# Patient Record
Sex: Female | Born: 1980 | Race: White | Hispanic: No | Marital: Married | Smoking: Never smoker
Health system: Southern US, Community
[De-identification: ages and names within clinical notes are randomized; demographics above are authoritative.]

---

## 2020-01-24 ENCOUNTER — Encounter (HOSPITAL_COMMUNITY): Payer: Self-pay | Admitting: *Deleted

## 2020-01-24 ENCOUNTER — Emergency Department (HOSPITAL_COMMUNITY): Payer: BLUE CROSS/BLUE SHIELD

## 2020-01-24 ENCOUNTER — Other Ambulatory Visit: Payer: Self-pay

## 2020-01-24 ENCOUNTER — Emergency Department (HOSPITAL_COMMUNITY)
Admission: EM | Admit: 2020-01-24 | Discharge: 2020-01-24 | Disposition: A | Discharge status: Home / Self Care | Payer: BLUE CROSS/BLUE SHIELD | Attending: Emergency Medicine | Admitting: Emergency Medicine

## 2020-01-24 DIAGNOSIS — Y939 Activity, unspecified: Secondary | ICD-10-CM | POA: Diagnosis not present

## 2020-01-24 DIAGNOSIS — Y999 Unspecified external cause status: Secondary | ICD-10-CM | POA: Diagnosis not present

## 2020-01-24 DIAGNOSIS — S2242XA Multiple fractures of ribs, left side, initial encounter for closed fracture: Secondary | ICD-10-CM | POA: Diagnosis not present

## 2020-01-24 DIAGNOSIS — Z23 Encounter for immunization: Secondary | ICD-10-CM | POA: Insufficient documentation

## 2020-01-24 DIAGNOSIS — Y92410 Unspecified street and highway as the place of occurrence of the external cause: Secondary | ICD-10-CM | POA: Insufficient documentation

## 2020-01-24 DIAGNOSIS — S299XXA Unspecified injury of thorax, initial encounter: Secondary | ICD-10-CM | POA: Diagnosis present

## 2020-01-24 LAB — COMPREHENSIVE METABOLIC PANEL
ALT: 15 U/L (ref 0–44)
AST: 18 U/L (ref 15–41)
Albumin: 3.8 g/dL (ref 3.5–5.0)
Alkaline Phosphatase: 53 U/L (ref 38–126)
Anion gap: 8 (ref 5–15)
BUN: 8 mg/dL (ref 6–20)
CO2: 25 mmol/L (ref 22–32)
Calcium: 8.9 mg/dL (ref 8.9–10.3)
Chloride: 108 mmol/L (ref 98–111)
Creatinine, Ser: 0.79 mg/dL (ref 0.44–1.00)
GFR calc Af Amer: >60 mL/min (ref >60)
GFR calc non Af Amer: >60 mL/min (ref >60)
Glucose, Bld: 128 mg/dL — ABNORMAL HIGH (ref 70–99)
Potassium: 4 mmol/L (ref 3.5–5.1)
Sodium: 141 mmol/L (ref 135–145)
Total Bilirubin: 0.6 mg/dL (ref 0.3–1.2)
Total Protein: 6.6 g/dL (ref 6.5–8.1)

## 2020-01-24 LAB — CBC WITH DIFFERENTIAL/PLATELET
Abs Immature Granulocytes: 0.1 10*3/uL — ABNORMAL HIGH (ref 0.00–0.07)
Basophils Absolute: 0 10*3/uL (ref 0.0–0.1)
Basophils Relative: 0 %
Eosinophils Absolute: 0 10*3/uL (ref 0.0–0.5)
Eosinophils Relative: 0 %
HCT: 42 % (ref 36.0–46.0)
Hemoglobin: 13.3 g/dL (ref 12.0–15.0)
Immature Granulocytes: 1 %
Lymphocytes Relative: 5 %
Lymphs Abs: 1 10*3/uL (ref 0.7–4.0)
MCH: 27.8 pg (ref 26.0–34.0)
MCHC: 31.7 g/dL (ref 30.0–36.0)
MCV: 87.9 fL (ref 80.0–100.0)
Monocytes Absolute: 1.1 10*3/uL — ABNORMAL HIGH (ref 0.1–1.0)
Monocytes Relative: 6 %
Neutro Abs: 16.9 10*3/uL — ABNORMAL HIGH (ref 1.7–7.7)
Neutrophils Relative %: 88 %
Platelets: 243 10*3/uL (ref 150–400)
RBC: 4.78 MIL/uL (ref 3.87–5.11)
RDW: 14.2 % (ref 11.5–15.5)
WBC: 19.1 10*3/uL — ABNORMAL HIGH (ref 4.0–10.5)
nRBC: 0 % (ref 0.0–0.2)

## 2020-01-24 LAB — LIPASE, BLOOD: Lipase: 28 U/L (ref 11–51)

## 2020-01-24 MED ORDER — DIAZEPAM 2 MG PO TABS
2.0000 mg | ORAL_TABLET | Freq: Once | ORAL | Status: AC
  Administered 2020-01-24: 2 mg via ORAL
  Filled 2020-01-24: qty 1

## 2020-01-24 MED ORDER — ACETAMINOPHEN 500 MG PO TABS
1000.0000 mg | ORAL_TABLET | Freq: Once | ORAL | Status: AC
  Administered 2020-01-24: 1000 mg via ORAL
  Filled 2020-01-24: qty 2

## 2020-01-24 MED ORDER — IOHEXOL 300 MG/ML  SOLN
100.0000 mL | Freq: Once | INTRAMUSCULAR | Status: AC | PRN
  Administered 2020-01-24: 100 mL via INTRAVENOUS

## 2020-01-24 MED ORDER — TETANUS-DIPHTH-ACELL PERTUSSIS 5-2.5-18.5 LF-MCG/0.5 IM SUSP
0.5000 mL | Freq: Once | INTRAMUSCULAR | Status: AC
  Administered 2020-01-24: 0.5 mL via INTRAMUSCULAR
  Filled 2020-01-24: qty 0.5

## 2020-01-24 MED ORDER — MORPHINE SULFATE (PF) 4 MG/ML IV SOLN
6.0000 mg | Freq: Once | INTRAVENOUS | Status: AC
  Administered 2020-01-24: 6 mg via INTRAVENOUS
  Filled 2020-01-24: qty 2

## 2020-01-24 MED ORDER — ACETAMINOPHEN ER 650 MG PO TBCR: 650.0000 mg | EXTENDED_RELEASE_TABLET | Freq: Three times a day (TID) | ORAL | 0 refills | Status: DC

## 2020-01-24 MED ORDER — METHOCARBAMOL 500 MG PO TABS: 500.0000 mg | ORAL_TABLET | Freq: Two times a day (BID) | ORAL | 0 refills | Status: DC

## 2020-01-24 MED ORDER — ONDANSETRON HCL 4 MG/2ML IJ SOLN
4.0000 mg | Freq: Once | INTRAMUSCULAR | Status: AC
  Administered 2020-01-24: 4 mg via INTRAVENOUS
  Filled 2020-01-24: qty 2

## 2020-01-24 MED ORDER — OXYCODONE HCL 5 MG PO TABS
5.0000 mg | ORAL_TABLET | Freq: Once | ORAL | Status: AC
  Administered 2020-01-24: 5 mg via ORAL
  Filled 2020-01-24: qty 1

## 2020-01-24 MED ORDER — OXYCODONE-ACETAMINOPHEN 5-325 MG PO TABS: 1.0000 | ORAL_TABLET | ORAL | 0 refills | Status: DC | PRN
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
## 2020-01-24 NOTE — ED Provider Notes (Signed)
  Physical Exam  BP 116/67   Pulse 75   Temp 97.9 F (36.6 C) (Oral)   Resp 16   Ht 5\' 4"  (1.626 m)   Wt 68 kg   LMP  (LMP Unknown) Comment: on birth control  SpO2 97%   BMI 25.75 kg/m   Physical Exam  ED Course/Procedures     .Critical Care Performed by: , MD Authorized by: Derwood Kaplan, MD   Critical care provider statement:    Critical care time (minutes):  15   Critical care was necessary to treat or prevent imminent or life-threatening deterioration of the following conditions:  Trauma   Critical care was time spent personally by me on the following activities:  Discussions with consultants, evaluation of patient's response to treatment, examination of patient, ordering and performing treatments and interventions, ordering and review of laboratory studies, ordering and review of radiographic studies, pulse oximetry, re-evaluation of patient's condition, obtaining history from patient or surrogate and review of old charts    MDM   Patient had come in with chief complaint of trauma.   CT scan of the abdomen, pelvis and chest reviewed.  Patient has multiple rib fractures without any intrathoracic or intra-abdominal complications.  Pain was relatively well controlled with the medications provided in the ER.  Incentive spirometer given.  Patient is comfortable going home with pain control.  She has been advised to follow-up with her PCP in 1 week.  Strict ER return precautions have been discussed, and patient is agreeing with the plan and is comfortable with the workup done and the recommendations from the ER.     Derwood Kaplan, MD 01/24/20 1850
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
## 2020-01-24 NOTE — ED Notes (Signed)
Patient verbalizes understanding of discharge instructions. Opportunity for questioning and answers were provided. Pt discharged from ED. 

## 2020-01-24 NOTE — ED Notes (Signed)
Got patient undressed into a gown patient is resting with call bell in reach  
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
## 2023-04-04 ENCOUNTER — Ambulatory Visit: Payer: Medicaid Other | Admitting: Student

## 2023-04-04 ENCOUNTER — Encounter: Payer: Self-pay | Admitting: Student

## 2023-04-04 VITALS — BP 114/76 | HR 74 | Temp 98.3°F | Resp 16 | Ht 64.0 in | Wt 186.2 lb

## 2023-04-04 DIAGNOSIS — Z6831 Body mass index (BMI) 31.0-31.9, adult: Secondary | ICD-10-CM

## 2023-04-04 DIAGNOSIS — Z23 Encounter for immunization: Secondary | ICD-10-CM

## 2023-04-04 DIAGNOSIS — M545 Low back pain, unspecified: Secondary | ICD-10-CM | POA: Diagnosis not present

## 2023-04-04 DIAGNOSIS — Z0000 Encounter for general adult medical examination without abnormal findings: Secondary | ICD-10-CM | POA: Insufficient documentation

## 2023-04-04 MED ORDER — NAPROXEN 500 MG PO TABS: 500.0000 mg | ORAL_TABLET | Freq: Two times a day (BID) | ORAL | 1 refills | Status: AC

## 2023-04-04 MED ORDER — METHOCARBAMOL 750 MG PO TABS: 750.0000 mg | ORAL_TABLET | Freq: Two times a day (BID) | ORAL | 0 refills | Status: AC | PRN

## 2023-04-04 NOTE — Assessment & Plan Note (Signed)
Flu shot given today

## 2023-04-04 NOTE — Assessment & Plan Note (Signed)
I believe this is muscle strain versus sciatica. She was provided with stretching instructions. She will [place] robaxin and naproxen as needed.

## 2023-04-04 NOTE — Patient Instructions (Signed)
Naproxen- do not use any other NSAIDS while taking this.   Robaxin- is a muscle relaxer, use for muscle spasms and while at home.

## 2023-04-04 NOTE — Progress Notes (Signed)
Established Patient Office Visit  Subjective   Patient ID: RAIZEL WESOLOWSKI, female    DOB: 03/27/1981  Age: 42 y.o. MRN: [id number]  Chief Complaint  Patient presents with   Establish Care    Changed Drs. Has a pap every March at OB/GYN. Dr Enrique Sack. Had a Mammogram April of 2024.     HPI  Ozella Comins Daigneault 42 year old Caucasian female presents to establish care. She is married with 1 child and works in [employer]. Her medical history includes seasonal allergies using zyrtec daily for years.  Today she tells me of lumbar pain on the right side that has been present for a few months. She isn't able to recall a specific injury to the site. She denies swelling or redness on her back, no radiation down the right leg, no numbness or weakness of the extremity, using ice/heat and OTC medications for relief. She says the pain presents after lifting or bending (she does this for work) and interrupts her sleep at times. Pain is 1-2/10.     Review of Systems  Constitutional: Negative.   HENT: Negative.    Respiratory: Negative.    Cardiovascular: Negative.   Gastrointestinal: Negative.   Genitourinary: Negative.   Musculoskeletal:  Positive for back pain.  Skin: Negative.   Neurological: Negative.   Endo/Heme/Allergies: Negative.   Psychiatric/Behavioral: Negative.        Objective:     BP 114/76 (BP Location: Left Arm, Patient Position: Sitting)   Pulse 74   Temp 98.3 F (36.8 C)   Resp 16   Ht 5\' 4"  (1.626 m)   Wt 186 lb 4 oz (84.5 kg)   LMP 03/29/2023 (Approximate)   SpO2 99%   BMI 31.97 kg/m    Physical Exam Vitals reviewed.  Constitutional:      Appearance: Normal appearance.  Cardiovascular:     Rate and Rhythm: Normal rate and regular rhythm.     Pulses: Normal pulses.     Heart sounds: Normal heart sounds.  Pulmonary:     Effort: Pulmonary effort is normal.     Breath sounds: Normal breath sounds.  Abdominal:     General: Bowel sounds are  normal. There is no distension.     Palpations: Abdomen is soft.  Musculoskeletal:        General: No swelling or tenderness. Normal range of motion.     Right lower leg: No edema.     Left lower leg: No edema.  Skin:    General: Skin is warm and dry.     Capillary Refill: Capillary refill takes less than 2 seconds.  Neurological:     General: No focal deficit present.     Mental Status: She is alert and oriented to person, place, and time.  Psychiatric:        Mood and Affect: Mood normal.        Behavior: Behavior normal.         Assessment & Plan:   Problem List Items Addressed This Visit     Right lumbar pain - Primary    I believe this is muscle strain versus sciatica. She was provided with stretching instructions. She will [place] robaxin and naproxen as needed.       Preventative health care    Flu shot given today.      Relevant Orders   Influenza, MDCK, trivalent, PF(Flucelvax egg-free) (Completed)    Return in about 2 months (around 06/04/2023) for annual physical .  Edwena Blow, NP

## 2023-05-11 ENCOUNTER — Ambulatory Visit: Payer: Medicaid Other | Admitting: Student

## 2023-05-11 ENCOUNTER — Encounter: Payer: Self-pay | Admitting: Student

## 2023-05-11 VITALS — BP 126/80 | HR 81 | Temp 97.8°F | Resp 18 | Ht 64.0 in | Wt 186.2 lb

## 2023-05-11 DIAGNOSIS — J0110 Acute frontal sinusitis, unspecified: Secondary | ICD-10-CM | POA: Diagnosis not present

## 2023-05-11 DIAGNOSIS — R059 Cough, unspecified: Secondary | ICD-10-CM | POA: Diagnosis not present

## 2023-05-11 LAB — POC COVID19 BINAXNOW: SARS Coronavirus 2 Ag: NEGATIVE

## 2023-05-11 MED ORDER — BENZONATATE 100 MG PO CAPS: 100.0000 mg | ORAL_CAPSULE | Freq: Three times a day (TID) | ORAL | 0 refills | Status: DC | PRN

## 2023-05-11 MED ORDER — IPRATROPIUM BROMIDE 0.03 % NA SOLN: 2.0000 | Freq: Two times a day (BID) | NASAL | 0 refills | Status: DC

## 2023-05-11 NOTE — Assessment & Plan Note (Signed)
She will use ipratropium nasal spray twice a day, benzonatate cough suppressant pearls along with other supportive care measures. Instructions provided.

## 2023-05-11 NOTE — Progress Notes (Signed)
Acute Office Visit  Subjective:     Patient ID: Helen Cantu, female    DOB: [birth year], 42 y.o.   MRN: [id number]  Chief Complaint  Patient presents with   Sinus Problem    X 4 days, sinus pressure, clear discharge, sneezing, no sore throat, no ear issues, no fever, dry cough. She has been using OTC: Equate Cold & Flu.    HPI  Subjective:     Helen Cantu is a 42 y.o. female who presents for evaluation of sinus pain. Symptoms include: congestion, cough, foul rhinorrhea, nasal congestion, sinus pressure, and sneezing. Onset of symptoms was 4 days ago. Symptoms have been gradually worsening since that time. Past history is significant for no history of pneumonia or bronchitis. Patient is a non-smoker. Using cold and flu symptom OTC relief and nasal spray.   Review of Systems  Constitutional: Negative.   HENT:  Positive for congestion.   Eyes: Negative.   Respiratory:  Positive for cough. Negative for shortness of breath.   Cardiovascular: Negative.   Gastrointestinal: Negative.   Genitourinary: Negative.   Musculoskeletal:  Negative for myalgias.  Skin: Negative.   Neurological: Negative.   Endo/Heme/Allergies:  Positive for environmental allergies.  Psychiatric/Behavioral: Negative.          Objective:    BP 126/80 (BP Location: Left Arm, Patient Position: Sitting)   Pulse 81   Temp 97.8 F (36.6 C) (Temporal)   Resp 18   Ht 5\' 4"  (1.626 m)   Wt 186 lb 4 oz (84.5 kg)   SpO2 98%   BMI 31.97 kg/m    Physical Exam Vitals reviewed.  Constitutional:      Appearance: Normal appearance.  HENT:     Head: Normocephalic and atraumatic.     Right Ear: Tympanic membrane, ear canal and external ear normal.     Left Ear: Tympanic membrane and ear canal normal.     Nose: Congestion present. No rhinorrhea.     Mouth/Throat:     Mouth: Mucous membranes are moist.     Pharynx: Oropharynx is clear. Posterior oropharyngeal erythema present.  Eyes:      Conjunctiva/sclera: Conjunctivae normal.     Pupils: Pupils are equal, round, and reactive to light.  Cardiovascular:     Rate and Rhythm: Normal rate and regular rhythm.     Pulses: Normal pulses.     Heart sounds: Normal heart sounds.  Pulmonary:     Effort: Pulmonary effort is normal.     Breath sounds: Normal breath sounds. No stridor. No wheezing, rhonchi or rales.  Abdominal:     General: Abdomen is flat.     Palpations: Abdomen is soft.  Musculoskeletal:     Cervical back: Normal range of motion and neck supple. No tenderness.  Lymphadenopathy:     Cervical: No cervical adenopathy.  Skin:    General: Skin is warm and dry.     Capillary Refill: Capillary refill takes less than 2 seconds.  Neurological:     General: No focal deficit present.     Mental Status: She is alert and oriented to person, place, and time.  Psychiatric:        Mood and Affect: Mood normal.        Behavior: Behavior normal.     Results for orders placed or performed in visit on 05/11/23  POC COVID-19 BinaxNow  Result Value Ref Range   SARS Coronavirus 2 Ag Negative Negative  Assessment & Plan:   Problem List Items Addressed This Visit     Acute non-recurrent frontal sinusitis    She will use ipratropium nasal spray twice a day, benzonatate cough suppressant pearls along with other supportive care measures. Instructions provided.        Relevant Medications   ipratropium (ATROVENT) 0.03 % nasal spray   benzonatate (TESSALON PERLES) 100 MG capsule   Other Visit Diagnoses     Cough in adult    -  Primary   Relevant Orders   POC COVID-19 BinaxNow (Completed)       Meds ordered this encounter  Medications   ipratropium (ATROVENT) 0.03 % nasal spray    Sig: Place 2 sprays into both nostrils every 12 (twelve) hours.    Dispense:  30 mL    Refill:  0   benzonatate (TESSALON PERLES) 100 MG capsule    Sig: Take 1 capsule (100 mg total) by mouth 3 (three) times daily as needed for  cough.    Dispense:  30 capsule    Refill:  0    Return if symptoms worsen or fail to improve.  Edwena Blow, NP

## 2023-05-11 NOTE — Patient Instructions (Addendum)
Please add Zytrec  daily and Mucinex use as directed. Also increase your oral hydration.   What to use for symptom management: pseudoephedrine (for decongestant) antihistamine (like Zyrtec, Claritin, or Allegra) (to dry up) Guaifenesin (to thin out and break up mucus) Dextromethorphan (if coughing) acetaminophen (for aches, pain, or fever) Coricidin HBP (use for nasal congestion if you have high blood pressure)

## 2023-05-25 ENCOUNTER — Other Ambulatory Visit: Payer: Self-pay | Admitting: Student

## 2023-05-31 ENCOUNTER — Encounter: Payer: Self-pay | Admitting: Student

## 2023-05-31 ENCOUNTER — Ambulatory Visit: Payer: Medicaid Other | Admitting: Student

## 2023-05-31 VITALS — BP 116/82 | HR 74 | Temp 97.7°F | Resp 18 | Ht 64.0 in | Wt 180.2 lb

## 2023-05-31 DIAGNOSIS — E669 Obesity, unspecified: Secondary | ICD-10-CM

## 2023-05-31 DIAGNOSIS — Z6830 Body mass index (BMI) 30.0-30.9, adult: Secondary | ICD-10-CM

## 2023-05-31 DIAGNOSIS — Z1321 Encounter for screening for nutritional disorder: Secondary | ICD-10-CM

## 2023-05-31 DIAGNOSIS — Z0000 Encounter for general adult medical examination without abnormal findings: Secondary | ICD-10-CM

## 2023-05-31 DIAGNOSIS — Z1329 Encounter for screening for other suspected endocrine disorder: Secondary | ICD-10-CM | POA: Diagnosis not present

## 2023-05-31 NOTE — Progress Notes (Signed)
 Patient: Helen Cantu, Female    DOB: Jul 24, 1980, 42 y.o.   MRN: [id number] Nasario Badder, NP Visit Date: 05/31/2023   Chief Complaint  Patient presents with   Annual Exam    Annual exam, she is fasting this morning and she stated she goes to Dr Valeri Gate in [place] for OB/GYN exams. Last Mammogram was in October 2024, follow up Mammogram in April of 2025. They are monitoring a spot on her right breast.   Subjective:   Annual physical exam:  Helen Cantu is a 42 y.o. female who presents today for annual health maintenance exam: The patient may be due for the following health maintenance studies: mammogram, colonoscopy, bone density screening, cervical pap/breast exam, and screening labs.    For exercise and activity the patient participates in frequent walking.  Diet: low calorie intake and monitoring portion  Smoke: does not smoke Alcohol screening: does not drink alcohol Eye Exam: she has cataracts 04/2023 last visit. Dental Exam: needs updating   Past medical, surgical, social and family histories reviewed and updated.    Separate from wellness visit today:  **The patient was informed that charges may be incurred for discussing concerns outside of annual exam.   SDOH Screenings   Depression (PHQ2-9): Low Risk  (04/04/2023)  Tobacco Use: Low Risk  (05/31/2023)  Recent Concern: Tobacco Use - Medium Risk (04/04/2023)     USPSTF grade A and B recommendations - reviewed and addressed today  Immunizations and Health Maintenance: Health Maintenance  Topic Date Due   HIV Screening  Never done   Hepatitis C Screening  Never done   Cervical Cancer Screening (HPV/Pap Cotest)  Never done   COVID-19 Vaccine (1 - 2024-25 season) Never done   DTaP/Tdap/Td (2 - Td or Tdap) 01/23/2030   INFLUENZA VACCINE  Completed   HPV VACCINES  Aged Out      Immunizations: Immunization History  Administered Date(s) Administered   Influenza, Mdck, Trivalent,PF 6+  MOS(egg free) 04/04/2023   Tdap 01/24/2020     STD testing and prevention (HIV/chl/gon/syphilis):  see above, no additional testing desired by pt today  Intimate partner violence: Denies concerns  Sexual History/Pain during Intercourse: Married  Menstrual History/LMP/Abnormal Bleeding:  Patient's last menstrual period was 05/17/2023 (approximate).  Incontinence Symptoms: denies   Breast cancer:  Last Mammogram: Last mammogram 03/2023 following a spot seen on imaging, next exam due in 08/2023 BRCA gene screening: n/a  Cervical cancer screening: not due until 2027- 07/2020 with negative HPV Pt denies family hx of cancers - breast, ovarian, uterine, colon:     Osteoporosis:   Discussion on osteoporosis per age, including high calcium and vitamin D  supplementation, weight bearing exercises Pt is does not supplementing with daily calcium/Vit D. Roughly experienced menopause at age n/a  High risk: Get a DEXA scan every two years Moderate risk: Get a DEXA scan every 3 to 5 years Low risk: Get a DEXA scan every 10 to 15 years   Skin cancer:  Hx of skin CA -none, family history.  Discussed atypical lesions.   Colorectal cancer:   Colonoscopy is n/a   Discussed concerning signs and sx of CRC, pt denies.   Lung cancer:   Low Dose CT Chest recommended if Age 47-80 years, 20 pack-year currently smoking OR have quit w/in 15years. Patient does not qualify.    Social History   Tobacco Use   Smoking status: Never    Passive exposure: Never   Smokeless tobacco:  Never  Substance Use Topics   Drug use: Never       No family history on file.   Blood pressure/Hypertension: BP Readings from Last 3 Encounters:  05/31/23 116/82  05/11/23 126/80  04/04/23 114/76     Social History       Social History   Socioeconomic History   Marital status: Married    Spouse name: Not on file   Number of children: Not on file   Years of education: Not on file   Highest education level:  Not on file  Occupational History   Not on file  Tobacco Use   Smoking status: Never    Passive exposure: Never   Smokeless tobacco: Never  Substance and Sexual Activity   Alcohol use: Not on file    Comment: at age 59   Drug use: Never   Sexual activity: Not on file    Comment: Uses birth control pill  Other Topics Concern   Not on file  Social History Narrative   Not on file   Social Drivers of Health   Financial Resource Strain: Not on file  Food Insecurity: Not on file  Transportation Needs: Not on file  Physical Activity: Not on file  Stress: Not on file  Social Connections: Not on file    Family History       No family history on file.  Patient Active Problem List   Diagnosis Date Noted   Acute non-recurrent frontal sinusitis 05/11/2023   Right lumbar pain 04/04/2023   Preventative health care 04/04/2023    No past surgical history on file.   Current Outpatient Medications:    cetirizine (ZYRTEC) 10 MG tablet, Take 10 mg by mouth daily. Takes all year round for allergies, Disp: , Rfl:    methocarbamol  (ROBAXIN -750) 750 MG tablet, Take 1 tablet (750 mg total) by mouth 2 (two) times daily as needed for muscle spasms., Disp: 30 tablet, Rfl: 0   Multiple Vitamin (MULTIVITAMIN ADULT PO), Take 1 tablet by mouth daily., Disp: , Rfl:    naproxen  (NAPROSYN ) 500 MG tablet, Take 1 tablet (500 mg total) by mouth 2 (two) times daily with a meal., Disp: 30 tablet, Rfl: 1   norethindrone (MICRONOR) 0.35 MG tablet, Take 1 tablet by mouth daily., Disp: , Rfl:   No Active Allergies  Patient Care Team: Elonda Giuliano, Trina Fujita, NP as PCP - General (Internal Medicine)  Advanced Care Planning:  A voluntary discussion about advance care planning including the explanation and discussion of advance directives.   Discussed health care proxy and Living will, and the patient was able to identify a health care proxy as her husband.   Patient doe not have a living will at present time.     Review of Systems Review of Systems  Constitutional: Negative.   HENT: Negative.    Eyes: Negative.   Respiratory: Negative.    Cardiovascular: Negative.   Gastrointestinal: Negative.   Genitourinary: Negative.   Musculoskeletal: Negative.   Skin: Negative.   Neurological: Negative.   Endo/Heme/Allergies: Negative.   Psychiatric/Behavioral: Negative.    All other systems reviewed and are negative.         Objective:   Vitals:   BP 116/82 (BP Location: Right Arm, Patient Position: Sitting)   Pulse 74   Temp 97.7 F (36.5 C) (Temporal)   Resp 18   Ht 5\' 4"  (1.626 m)   Wt 180 lb 4 oz (81.8 kg)   LMP 05/17/2023 (Approximate)  SpO2 96%   BMI 30.94 kg/m    Physical Examination Physical Exam Vitals reviewed.  Constitutional:      Appearance: Normal appearance. She is obese.  HENT:     Head: Normocephalic and atraumatic.     Right Ear: Tympanic membrane, ear canal and external ear normal.     Left Ear: Tympanic membrane, ear canal and external ear normal.     Nose: Nose normal.     Mouth/Throat:     Mouth: Mucous membranes are dry.     Pharynx: Oropharynx is clear.  Eyes:     Conjunctiva/sclera: Conjunctivae normal.     Pupils: Pupils are equal, round, and reactive to light.  Cardiovascular:     Rate and Rhythm: Normal rate and regular rhythm.     Pulses: Normal pulses.     Heart sounds: Normal heart sounds.  Pulmonary:     Effort: Pulmonary effort is normal.  Abdominal:     General: Bowel sounds are normal.     Palpations: Abdomen is soft.     Tenderness: There is no abdominal tenderness.  Musculoskeletal:        General: Normal range of motion.     Cervical back: Normal range of motion and neck supple.     Right lower leg: No edema.     Left lower leg: No edema.  Lymphadenopathy:     Cervical: No cervical adenopathy.  Skin:    General: Skin is warm and dry.     Capillary Refill: Capillary refill takes less than 2 seconds.  Neurological:      General: No focal deficit present.     Mental Status: She is alert and oriented to person, place, and time.  Psychiatric:        Mood and Affect: Mood normal.        Behavior: Behavior normal.          Assessment & Plan:     Obesity (BMI 30-39.9) Assessment & Plan: She is fasting today and has been reducing caloric intake. She has lost 8 lbs since last visit and plans to join the gym. Instructions for healy diet included. Labs for A1C and lipids.   Orders: -     Hemoglobin A1c -     Lipid panel  Encounter for annual physical exam Assessment & Plan: CPE completed today with screening labs. The patient is doing well overall and does not have any complaints today.    USPSTF grade A and B recommendations reviewed with patient; age-appropriate recommendations, preventive care, screening tests, etc discussed and encouraged; healthy living encouraged; see AVS for patient education given to patient   Discussed importance of 150 minutes of physical activity weekly, AHA exercise recommendations given to pt in AVS/handout   Discussed importance of healthy diet:  eating lean meats and proteins, avoiding trans fats and saturated fats, avoid simple sugars and excessive carbs in diet, eat 6 servings of fruit/vegetables daily and drink plenty of water and avoid sweet beverages.     Recommended pt to do annual eye exam and routine dental exams/cleanings   Depression, alcohol, fall screening completed as documented above and per flowsheets   Advance Care planning information and packet discussed and offered today, encouraged pt to discuss with family members/spouse/partner/friends and complete Advanced directive packet and bring copy to office    Reviewed Health Maintenance: vaccine, dental health.   Orders: -     HIV Antibody (routine testing w rflx) -     Hepatitis  C antibody -     CBC with Differential/Platelet -     Comprehensive metabolic panel  Encounter for vitamin deficiency  screening -     VITAMIN D  25 Hydroxy (Vit-D Deficiency, Fractures)  Screening for thyroid disorder -     TSH        Health Maintenance  Topic Date Due   HIV Screening  Never done   Hepatitis C Screening  Never done   Cervical Cancer Screening (HPV/Pap Cotest)  Never done   COVID-19 Vaccine (1 - 2024-25 season) Never done   DTaP/Tdap/Td (2 - Td or Tdap) 01/23/2030   INFLUENZA VACCINE  Completed   HPV VACCINES  Aged [employer] Yena Tisby, NP 05/31/23 8:55 AM

## 2023-05-31 NOTE — Assessment & Plan Note (Addendum)
 CPE completed today with screening labs. The patient is doing well overall and does not have any complaints today.    USPSTF grade A and B recommendations reviewed with patient; age-appropriate recommendations, preventive care, screening tests, etc discussed and encouraged; healthy living encouraged; see AVS for patient education given to patient   Discussed importance of 150 minutes of physical activity weekly, AHA exercise recommendations given to pt in AVS/handout   Discussed importance of healthy diet:  eating lean meats and proteins, avoiding trans fats and saturated fats, avoid simple sugars and excessive carbs in diet, eat 6 servings of fruit/vegetables daily and drink plenty of water and avoid sweet beverages.     Recommended pt to do annual eye exam and routine dental exams/cleanings   Depression, alcohol, fall screening completed as documented above and per flowsheets   Advance Care planning information and packet discussed and offered today, encouraged pt to discuss with family members/spouse/partner/friends and complete Advanced directive packet and bring copy to office    Reviewed Health Maintenance: vaccine, dental health.

## 2023-05-31 NOTE — Assessment & Plan Note (Signed)
She is fasting today and has been reducing caloric intake. She has lost 8 lbs since last visit and plans to join the gym. Instructions for healy diet included. Labs for A1C and lipids.

## 2023-05-31 NOTE — Patient Instructions (Addendum)
 Supplementing Calcium and Magnesium is  for your bone health. You can get this over the counter. Make sure there is at least 1200 mg of calcium and 250 mg of magnesium.    Dental Exam.

## 2023-06-01 LAB — COMPREHENSIVE METABOLIC PANEL
ALT: 9 [IU]/L (ref 0–32)
AST: 9 [IU]/L (ref 0–40)
Albumin: 4.1 g/dL (ref 3.9–4.9)
Alkaline Phosphatase: 76 [IU]/L (ref 44–121)
BUN/Creatinine Ratio: 13 (ref 9–23)
BUN: 10 mg/dL (ref 6–24)
Bilirubin Total: 0.4 mg/dL (ref 0.0–1.2)
CO2: 23 mmol/L (ref 20–29)
Calcium: 9.1 mg/dL (ref 8.7–10.2)
Chloride: 105 mmol/L (ref 96–106)
Creatinine, Ser: 0.8 mg/dL (ref 0.57–1.00)
Globulin, Total: 2.6 g/dL (ref 1.5–4.5)
Glucose: 100 mg/dL — ABNORMAL HIGH (ref 70–99)
Potassium: 3.9 mmol/L (ref 3.5–5.2)
Sodium: 141 mmol/L (ref 134–144)
Total Protein: 6.7 g/dL (ref 6.0–8.5)
eGFR: 94 mL/min/{1.73_m2} (ref >59)

## 2023-06-01 LAB — CBC WITH DIFFERENTIAL/PLATELET
Basophils Absolute: 0 10*3/uL (ref 0.0–0.2)
Basos: 0 %
EOS (ABSOLUTE): 0.2 10*3/uL (ref 0.0–0.4)
Eos: 3 %
Hematocrit: 42.3 % (ref 34.0–46.6)
Hemoglobin: 13.9 g/dL (ref 11.1–15.9)
Immature Grans (Abs): 0 10*3/uL (ref 0.0–0.1)
Immature Granulocytes: 0 %
Lymphocytes Absolute: 1.2 10*3/uL (ref 0.7–3.1)
Lymphs: 17 %
MCH: 28 pg (ref 26.6–33.0)
MCHC: 32.9 g/dL (ref 31.5–35.7)
MCV: 85 fL (ref 79–97)
Monocytes Absolute: 0.5 10*3/uL (ref 0.1–0.9)
Monocytes: 6 %
Neutrophils Absolute: 5.4 10*3/uL (ref 1.4–7.0)
Neutrophils: 74 %
Platelets: 249 10*3/uL (ref 150–450)
RBC: 4.97 x10E6/uL (ref 3.77–5.28)
RDW: 14.1 % (ref 11.7–15.4)
WBC: 7.3 10*3/uL (ref 3.4–10.8)

## 2023-06-01 LAB — HEMOGLOBIN A1C
Est. average glucose Bld gHb Est-mCnc: 105 mg/dL
Hgb A1c MFr Bld: 5.3 % (ref 4.8–5.6)

## 2023-06-01 LAB — HIV ANTIBODY (ROUTINE TESTING W REFLEX): HIV Screen 4th Generation wRfx: NONREACTIVE

## 2023-06-01 LAB — LIPID PANEL
Chol/HDL Ratio: 4.8 {ratio} — ABNORMAL HIGH (ref 0.0–4.4)
Cholesterol, Total: 159 mg/dL (ref 100–199)
HDL: 33 mg/dL — ABNORMAL LOW (ref >39)
LDL Chol Calc (NIH): 108 mg/dL — ABNORMAL HIGH (ref 0–99)
Triglycerides: 96 mg/dL (ref 0–149)
VLDL Cholesterol Cal: 18 mg/dL (ref 5–40)

## 2023-06-01 LAB — TSH: TSH: 5.3 u[IU]/mL — ABNORMAL HIGH (ref 0.450–4.500)

## 2023-06-01 LAB — VITAMIN D 25 HYDROXY (VIT D DEFICIENCY, FRACTURES): Vit D, 25-Hydroxy: 17.3 ng/mL — ABNORMAL LOW (ref 30.0–100.0)

## 2023-06-01 LAB — HEPATITIS C ANTIBODY: Hep C Virus Ab: NONREACTIVE

## 2023-06-02 NOTE — Progress Notes (Signed)
 I have reviewed the results of your recent test and lab work that you underwent at our clinic. Findings indicate hemoglobic A1c, blood counts, kidney function, and electrolytes are within acceptable limits.  Your thyroid function is elevated, I am adding a lab to evaluate for underactive thyroid.  Your cholesterol is slightly elevated. Try to avoid fried foods and fatty foods (red meats, pork products, whole milk products, and egg yolks) and try to eat more fresh fruits and vegetables.    Your vitamin D  level is lower than normal. Using a daily multivitamin supplement with D3 is beneficial. You should supplement with 800 to 1000 international units (20 to 25 micrograms) daily. This can be purchased over the counter.

## 2023-06-02 NOTE — Progress Notes (Signed)
 Leak, Daphne CROME, NP  Marget Browning, CMA I have reviewed the results of your recent test and lab work that you underwent at our clinic. Findings indicate hemoglobic A1c, blood counts, kidney function, and electrolytes are within acceptable limits. Your thyroid function is elevated, I am adding a lab to evaluate for underactive thyroid. Your cholesterol is slightly elevated. Try to avoid fried foods and fatty foods (red meats, pork products, whole milk products, and egg yolks) and try to eat more fresh fruits and vegetables.   Your vitamin D  level is lower than normal. Using a daily multivitamin supplement with D3 is beneficial. You should supplement with 800 to 1000 international units (20 to 25 micrograms) daily. This can be purchased over the counter.   Pt informed results and recommendations.  Will call her with Free T4 results.  And patient was informed that we are no longer seeing patients with [facility], she would need to either switch to different plan under Medicaid or she will need to seek a new provider.
